# Patient Record
Sex: Male | Born: 1965 | Race: White | Hispanic: No | Marital: Married | Smoking: Never smoker
Health system: Southern US, Community
[De-identification: ages and names within clinical notes are randomized; demographics above are authoritative.]

## PROBLEM LIST (undated history)

## (undated) DIAGNOSIS — F411 Generalized anxiety disorder: Secondary | ICD-10-CM

## (undated) HISTORY — DX: Generalized anxiety disorder: F41.1

---

## 1998-11-03 ENCOUNTER — Emergency Department (HOSPITAL_COMMUNITY): Admission: EM | Admit: 1998-11-03 | Discharge: 1998-11-03 | Payer: Self-pay | Admitting: Emergency Medicine

## 1998-11-03 ENCOUNTER — Encounter: Payer: Self-pay | Admitting: Emergency Medicine

## 1998-11-06 ENCOUNTER — Emergency Department (HOSPITAL_COMMUNITY): Admission: EM | Admit: 1998-11-06 | Discharge: 1998-11-06 | Payer: Self-pay | Admitting: *Deleted

## 2004-06-30 ENCOUNTER — Emergency Department (HOSPITAL_COMMUNITY): Admission: AC | Admit: 2004-06-30 | Discharge: 2004-06-30 | Payer: Self-pay
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
## 2017-12-21 ENCOUNTER — Other Ambulatory Visit: Payer: Self-pay | Admitting: Family Medicine

## 2017-12-21 DIAGNOSIS — R4789 Other speech disturbances: Secondary | ICD-10-CM

## 2017-12-26 ENCOUNTER — Ambulatory Visit
Admission: RE | Admit: 2017-12-26 | Discharge: 2017-12-26 | Disposition: A | Discharge status: Home / Self Care | Payer: 59 | Source: Ambulatory Visit | Attending: Family Medicine | Admitting: Family Medicine

## 2017-12-26 DIAGNOSIS — R4789 Other speech disturbances: Secondary | ICD-10-CM

## 2018-02-22 ENCOUNTER — Encounter: Payer: Self-pay | Admitting: Neurology

## 2018-02-22 ENCOUNTER — Ambulatory Visit: Payer: 59 | Admitting: Neurology

## 2018-02-22 VITALS — BP 130/80 | HR 85 | Ht 70.0 in | Wt 188.5 lb

## 2018-02-22 DIAGNOSIS — R569 Unspecified convulsions: Secondary | ICD-10-CM | POA: Insufficient documentation

## 2018-02-22 MED ORDER — LAMOTRIGINE 100 MG PO TABS: 100.0000 mg | ORAL_TABLET | Freq: Two times a day (BID) | ORAL | 11 refills | Status: DC

## 2018-02-22 MED ORDER — LAMOTRIGINE 25 MG PO TABS: 25.0000 mg | ORAL_TABLET | Freq: Every day | ORAL | 0 refills | Status: DC

## 2018-02-22 NOTE — Progress Notes (Signed)
PATIENT: Corey Nelson DOB: 11-28-1965  Chief Complaint  Patient presents with  . Referral    Referral from  Dr. Andrey Campanile spells of speech loss room 4 wirh wife, pt goes to [place]      HISTORICAL  Corey Nelson is a 52 year old male, accompanied by his wife, seen in request by his primary care physician Dr. Andrey Campanile for evaluation of speech loss, initial evaluation was on February 22, 2018.  I have reviewed and summarized the referring note from the referring physician.  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, it tends to happen in his leisure time, but multiple episode a day, especially every morning when he brushes things, take a shower, he will work himself into a spell,  He was given Lexapro 10 mg daily, which has cut back the frequency of the spell, but each spell lasts longer, he was recently started on clonazepam 0.5 mg twice a day, which did not provide significant help, he also complains of excessive fatigue,     He denies history of seizure, recent laboratory evaluation from the [facility] was reported normal  I have personally reviewed MRI of the brain without contrast on December 26, 2017 that was normal  REVIEW OF SYSTEMS: Full 14 system review of systems performed and notable only for weight loss, fatigue, hearing loss, cough, confusion, allergy, anxiety, decreased energy All other review of systems were negative.  ALLERGIES: No Known Allergies  HOME MEDICATIONS: Current Outpatient Medications    Medication Sig Dispense Refill  . clonazePAM (KLONOPIN) 0.5 MG tablet Take one tablet twice a day for anxiety    . diphenhydrAMINE HCl (ALLERGY MED PO) Take by mouth.    Marland Kitchen ibuprofen (ADVIL,MOTRIN) 100 MG chewable tablet Chew by mouth every 8 (eight) hours as needed.     No current facility-administered medications for this visit.     PAST MEDICAL HISTORY: Past Medical History:  Diagnosis Date  . GAD (generalized anxiety disorder)     PAST SURGICAL HISTORY: History reviewed. No pertinent surgical history.  FAMILY HISTORY: Family History  Problem Relation Age of Onset  . Dementia Mother     SOCIAL HISTORY: Social History   Socioeconomic History  . Marital status: Married    Spouse name: Not on file  . Number of children: Not on file  . Years of education: Not on file  . Highest education level: Not on file  Occupational History  . Not on file  Social Needs  . Financial resource strain: Not on file  . Food insecurity:    Worry: Not on file    Inability: Not on file  . Transportation needs:    Medical: Not on file    Non-medical: Not on file  Tobacco Use  . Smoking status: Never Smoker  . Smokeless tobacco: Never Used  Substance and Sexual Activity  . Alcohol use: Not Currently  . Drug use: Not Currently  . Sexual activity: Not on file  Lifestyle  . Physical activity:    Days  per week: Not on file    Minutes per session: Not on file  . Stress: Not on file  Relationships  . Social connections:    Talks on phone: Not on file    Gets together: Not on file    Attends religious service: Not on file    Active member of club or organization: Not on file    Attends meetings of clubs or organizations: Not on file    Relationship status: Not on file  . Intimate partner violence:    Fear of current or ex partner: Not on file    Emotionally abused: Not on file    Physically abused: Not on file    Forced sexual activity: Not on file  Other Topics Concern  . Not on  file  Social History Narrative  . Not on file     PHYSICAL EXAM   Vitals:   02/22/18 0750  BP: 130/80  Pulse: 85  Weight: 188 lb 8 oz (85.5 kg)  Height: 5\' 10"  (1.778 m)    Not recorded      Body mass index is 27.05 kg/m.  PHYSICAL EXAMNIATION:  Gen: NAD, conversant, well nourised, obese, well groomed                     Cardiovascular: Regular rate rhythm, no peripheral edema, warm, nontender. Eyes: Conjunctivae clear without exudates or hemorrhage Neck: Supple, no carotid bruits. Pulmonary: Clear to auscultation bilaterally   NEUROLOGICAL EXAM:  MENTAL STATUS: Speech:    Speech is normal; fluent and spontaneous with normal comprehension.  Cognition:     Orientation to time, place and person     Normal recent and remote memory     Normal Attention span and concentration     Normal Language, naming, repeating,spontaneous speech     Fund of knowledge   CRANIAL NERVES: CN II: Visual fields are full to confrontation. Fundoscopic exam is normal with sharp discs and no vascular changes. Pupils are round equal and briskly reactive to light. CN III, IV, VI: extraocular movement are normal. No ptosis. CN V: Facial sensation is intact to pinprick in all 3 divisions bilaterally. Corneal responses are intact.  CN VII: Face is symmetric with normal eye closure and smile. CN VIII: Hearing is normal to rubbing fingers CN IX, X: Palate elevates symmetrically. Phonation is normal. CN XI: Head turning and shoulder shrug are intact CN XII: Tongue is midline with normal movements and no atrophy.  MOTOR: There is no pronator drift of out-stretched arms. Muscle bulk and tone are normal. Muscle strength is normal.  REFLEXES: Reflexes are 2+ and symmetric at the biceps, triceps, knees, and ankles. Plantar responses are flexor.  SENSORY: Intact to light touch, pinprick, positional sensation and vibratory sensation are intact in fingers and toes.  COORDINATION: Rapid alternating  movements and fine finger movements are intact. There is no dysmetria on finger-to-nose and heel-knee-shin.    GAIT/STANCE: Posture is normal. Gait is steady with normal steps, base, arm swing, and turning. Heel and toe walking are normal. Tandem gait is normal.  Romberg is absent.   DIAGNOSTIC DATA (LABS, IMAGING, TESTING) - I reviewed patient records, labs, notes, testing and imaging myself where available.   ASSESSMENT AND PLAN  Corey Nelson is a 52 y.o. male   Recurrent spells of sudden onset language difficulty  Possibility including partial seizure, versus anxiety related disorder  MRI of the brain was normal  EEG  Empirically treat him with  lamotrigine titrating to 100 mg twice a day  Keep clonazepam 0.5 mg twice a day he is [facility]  Document all events, return to clinic in 3 months   Levert FeinsteinYijun Ashni Lonzo, M.D. Ph.D.  [facility] [address], [address] [place], [place] [address] Ph: [phone] Fax: [phone]  CC:  Barbie BannerWilson, Fred H, MD

## 2018-03-01 ENCOUNTER — Other Ambulatory Visit: Payer: 59

## 2018-03-02 ENCOUNTER — Ambulatory Visit: Payer: 59 | Admitting: Neurology

## 2018-03-02 ENCOUNTER — Telehealth: Payer: Self-pay | Admitting: *Deleted

## 2018-03-02 DIAGNOSIS — R569 Unspecified convulsions: Secondary | ICD-10-CM

## 2018-03-02 NOTE — Telephone Encounter (Signed)
Patient is here for an EEG and is wondering if he could speak to someone about the medication that was prescribed.  States he is doing great with little side effect but is suppose to increase the dose and wonders if he should?  Please advise.

## 2018-03-02 NOTE — Telephone Encounter (Signed)
Dr. Terrace Arabia placed patient on a titrating dose of Lamictal 25mg .  She had planned for him to work his way up to 100mg , BID.  However, the patient confused the instructions and started taking 100mg , BID right away.  He started this dose on 02/22/18.  He only experienced mild dizziness that did not last more than a day.  Since this time, he has been doing well.  In fact, he said he is extremely pleased with the medication.  No episodes and feeling much better.  He will continue with his current Lamictal dose of 100mg , BID.  Dr. Terrace Arabia is aware of this event.

## 2018-03-04 NOTE — Procedures (Signed)
   HISTORY: 52 years old male, presented with recurrent episode of sudden word finding difficulties.  TECHNIQUE:  16 channel EEG was performed based on standard 10-16 international system. One channel was dedicated to EKG, which has demonstrates normal sinus rhythm of beats per minutes.  Upon awakening, the posterior background activity was well-developed, with diffuse small amplitude beta range activity reactive to eye opening and closure.  There was no evidence of epileptiform discharge.  Photic stimulation was performed, which induced a symmetric photic driving.  Hyperventilation was performed, there was no abnormality elicit.  No sleep was achieved.  CONCLUSION: This is a  normal awake EEG.  There is no electrodiagnostic evidence of epileptiform discharge.  The increased beta range activity is usually related to benzodiazepine use.  Levert Feinstein, M.D. Ph.D.  [facility] [address] [place], [place] [address] Phone: [phone] Fax:      [phone]

## 2018-05-24 ENCOUNTER — Encounter: Payer: Self-pay | Admitting: Neurology

## 2018-05-24 ENCOUNTER — Ambulatory Visit: Payer: 59 | Admitting: Neurology

## 2018-05-24 VITALS — BP 149/86 | HR 98 | Ht 70.0 in | Wt 190.0 lb

## 2018-05-24 DIAGNOSIS — F809 Developmental disorder of speech and language, unspecified: Secondary | ICD-10-CM | POA: Diagnosis not present

## 2018-05-24 MED ORDER — LAMOTRIGINE 100 MG PO TABS: 100.0000 mg | ORAL_TABLET | Freq: Two times a day (BID) | ORAL | 4 refills | Status: DC

## 2018-05-24 NOTE — Progress Notes (Signed)
PATIENT: Corey Nelson DOB: 07/13/1965  Chief Complaint  Patient presents with  . Seizure-like activity    He is here with his wife, [name].  He would like to review his brain MRI and EEG results. He is doing well on Lamictal 100mg  BID.  No further seizure-like activity since starting the medication.     HISTORICAL  Corey Nelson is a 52 year old male, accompanied by his wife, seen in request by his primary care physician Dr. Andrey CampanileWilson for evaluation of speech loss, initial evaluation was on February 22, 2018.  I have reviewed and summarized the referring note from the referring physician.  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, it tends to happen in his leisure time, but multiple episode a day, especially every morning when he brushes his teeth, take a shower, he will work himself into a spell,  He was given Lexapro 10 mg daily, which has cut back the frequency of the spell, but each spell lasts longer, he was recently started on clonazepam 0.5 mg twice a day, which did not provide significant help, he also complains of excessive fatigue,     He denies history of seizure, [facility]  I have personally reviewed MRI of the brain without contrast on December 26, 2017 that was normal  UPDATE May 24 2018: He started lamotrigine 100 mg, after 1 tablet, his daily spells of recurrent language difficulty spells has disappeared, he is not taking lamotrigine 100 mg twice a day, tolerating  well,  We personally reviewed MRI of the brain in July 2019 that was normal,  EEG was normal  REVIEW OF SYSTEMS: Full 14 system review of systems performed and notable only for fatigue, excessive sweating, hearing loss, ringing in ears, runny nose, constipation, joint pain, headaches, All other review of systems were negative.  ALLERGIES: No Known Allergies  HOME MEDICATIONS: Current Outpatient Medications  Medication Sig Dispense Refill  . diphenhydrAMINE HCl (ALLERGY MED PO) Take by mouth.    Marland Kitchen. ibuprofen (ADVIL,MOTRIN) 100 MG chewable tablet Chew by mouth every 8 (eight) hours as needed.    . lamoTRIgine (LAMICTAL) 100 MG tablet Take 1 tablet (100 mg total) by mouth 2 (two) times daily. 60 tablet 11   No current facility-administered medications for this visit.     PAST MEDICAL HISTORY: Past Medical History:  Diagnosis Date  . GAD (generalized anxiety disorder)     PAST SURGICAL HISTORY: History reviewed. No pertinent surgical history.  FAMILY HISTORY: Family History  Problem Relation Age of Onset  . Dementia Mother     SOCIAL HISTORY: Social History   Socioeconomic History  . Marital status: Married    Spouse name: Not on file  . Number of children: Not on file  . Years of education: Not on file  . Highest education level: Not on file  Occupational History  . Not on file  Social Needs  . Financial resource strain: Not on file  . Food insecurity:    Worry: Not on  file    Inability: Not on file  . Transportation needs:    Medical: Not on file    Non-medical: Not on file  Tobacco Use  . Smoking status: Never Smoker  . Smokeless tobacco: Never Used  Substance and Sexual Activity  . Alcohol use: Not Currently  . Drug use: Not Currently  . Sexual activity: Not on file  Lifestyle  . Physical activity:    Days per week: Not on file    Minutes per session: Not on file  . Stress: Not on file  Relationships  . Social connections:    Talks on phone: Not on  file    Gets together: Not on file    Attends religious service: Not on file    Active member of club or organization: Not on file    Attends meetings of clubs or organizations: Not on file    Relationship status: Not on file  . Intimate partner violence:    Fear of current or ex partner: Not on file    Emotionally abused: Not on file    Physically abused: Not on file    Forced sexual activity: Not on file  Other Topics Concern  . Not on file  Social History Narrative  . Not on file     PHYSICAL EXAM   Vitals:   05/24/18 0711  BP: (!) 149/86  Pulse: 98  Weight: 190 lb (86.2 kg)  Height: 5\' 10"  (1.778 m)    Not recorded      Body mass index is 27.26 kg/m.  PHYSICAL EXAMNIATION:  Gen: NAD, conversant, well nourised, obese, well groomed                     Cardiovascular: Regular rate rhythm, no peripheral edema, warm, nontender. Eyes: Conjunctivae clear without exudates or hemorrhage Neck: Supple, no carotid bruits. Pulmonary: Clear to auscultation bilaterally   NEUROLOGICAL EXAM:  MENTAL STATUS: Speech:    Speech is normal; fluent and spontaneous with normal comprehension.  Cognition:     Orientation to time, place and person     Normal recent and remote memory     Normal Attention span and concentration     Normal Language, naming, repeating,spontaneous speech     Fund of knowledge   CRANIAL NERVES: CN II: Visual fields are full to confrontation.  Pupils are round equal and briskly reactive to light. CN III, IV, VI: extraocular movement are normal. No ptosis. CN V: Facial sensation is intact to pinprick in all 3 divisions bilaterally. Corneal responses are intact.  CN VII: Face is symmetric with normal eye closure and smile. CN VIII: Hearing is normal to rubbing fingers CN IX, X: Palate elevates symmetrically. Phonation is normal. CN XI: Head turning and shoulder shrug are intact CN XII: Tongue is midline with normal movements and no  atrophy.  MOTOR: There is no pronator drift of out-stretched arms. Muscle bulk and tone are normal. Muscle strength is normal.  REFLEXES: Reflexes are 2+ and symmetric at the biceps, triceps, knees, and ankles. Plantar responses are flexor.  SENSORY: Intact to light touch, pinprick, positional sensation and vibratory sensation are intact in fingers and toes.  COORDINATION: Rapid alternating movements and fine finger movements are intact. There is no dysmetria on finger-to-nose and heel-knee-shin.    GAIT/STANCE: Posture is normal. Gait is steady with normal steps, base, arm swing, and turning. Heel and toe walking are normal. Tandem gait is normal.  Romberg is absent.  DIAGNOSTIC DATA (LABS, IMAGING, TESTING) - I reviewed patient records, labs, notes, testing and imaging myself where available.   ASSESSMENT AND PLAN  FERGUS THRONE is a 52 y.o. male   Recurrent spells of sudden onset language difficulty  Possibility including partial seizure, versus anxiety related disorder  MRI of the brain was normal  EEG was normal  Responded very well to lamotrigine 100 mg twice a day  Refill his lamotrigine, only return to clinic for new issues   Levert Feinstein, M.D. Ph.D.  [facility] [address] [place], [place] [address] Ph: [phone] Fax: [phone]  CC:  Barbie Banner, MD

## 2018-06-20 ENCOUNTER — Telehealth: Payer: Self-pay | Admitting: Neurology

## 2018-06-20 NOTE — Telephone Encounter (Signed)
Pt states he was supposed to receive a 3 month supply for his lamoTRIgine (LAMICTAL) 100 MG tablet when he picked it up at the pharmacy it was only a 1 month supply. Please advise.

## 2018-06-20 NOTE — Telephone Encounter (Signed)
A 90-day rx with 4 additional refills was sent by Dr. Terrace Arabia on 05/24/18.  I returned the call to the patient to let him know.  He is going to check with the pharmacy.  I also notified him that he will need yearly follow up appointments for medication managment and he verbalized understanding.

## 2018-08-11 ENCOUNTER — Telehealth: Payer: Self-pay | Admitting: Neurology

## 2018-08-11 NOTE — Telephone Encounter (Signed)
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 Would it be beneficial for provider to review the report as it might relate to his case.

## 2018-08-11 NOTE — Telephone Encounter (Signed)
Per vo by Dr. Terrace Arabia, she feels it would be better for a toxicologist to further review the type of mold he has been exposed to.  I returned the call to the patient and he was in agreement with this plan.

## 2018-09-08 ENCOUNTER — Ambulatory Visit (INDEPENDENT_AMBULATORY_CARE_PROVIDER_SITE_OTHER): Payer: Worker's Compensation | Admitting: Allergy & Immunology

## 2018-09-08 ENCOUNTER — Other Ambulatory Visit: Payer: Self-pay

## 2018-09-08 VITALS — BP 122/80 | HR 81 | Temp 97.6°F | Resp 18 | Ht 70.0 in | Wt 196.4 lb

## 2018-09-08 DIAGNOSIS — R5383 Other fatigue: Secondary | ICD-10-CM | POA: Diagnosis not present

## 2018-09-08 DIAGNOSIS — R51 Headache: Secondary | ICD-10-CM | POA: Diagnosis not present

## 2018-09-08 DIAGNOSIS — R519 Headache, unspecified: Secondary | ICD-10-CM

## 2018-09-08 NOTE — Progress Notes (Signed)
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 Corey Nelson presents for an evaluation of mold allergies.  He has a longstanding history of fatigue and headaches, which he feels might be related to mold exposure at his previous workplace.  Although he does have some rhinorrhea, it does not seem to be consistent with exposure to the workplace.  It should be noted that his symptoms have continued unabated despite being away from this mold for approximately 3, now going on 4, months.  Therefore, I do not think that this is related to his symptoms, as if it were allergies symptoms would have resolved once he was not being exposed.  There was a finding of elevated mold spores in the environment, but per the patient and case managers report there was no comparison to outside spore levels.  Therefore, these might not be relevant at all.  Report, we might be able to compare it to outside pollen levels on the day of the sampling, but again there is so much variability in allergen testing and environment that the actual outside mold exposure right outside the work site might not even be reflective of samples obtained at pollen monitoring stations around the city, depending on their proximity to his previous work site.   We are going to have him come in for skin testing once he has been off of his antihistamines for an appropriate time frame. We are going to get some labs as well, including an extend mold IgE panel and a specific IgE for Stachybotyrs chartarum. All of this together will at least prove mold sensitization or lack thereof. However, it should be noted that sensitization does not equal causation.    Plan/Recommendations:   1. Fatigue and headaches - We will get some lab work to look for  evidence of mold allergies. - We will also schedule you for skin testing (you will need to be off of your Benadryl for 3 days).  - Take one prednisone tablet twice daily while you are off of the Benadryl (this might help control your vertigo while you are off of the antihistamines). - Bring the mold testing report.   2. Return in about 1 week (around 09/15/2018) for SKIN TESTING.    Subjective:   Corey Nelson is a 53 y.o. male presenting today for evaluation of No chief complaint on file.   Corey Nelson has a history of the following: Patient Active Problem List   Diagnosis Date Noted   Language difficulty 05/24/2018   Seizure-like activity (HCC) 02/22/2018    History obtained from: chart review and patient and workers comp case Production designer, theatre/television/film Corey Nelson).  Corey Nelson was referred by Corey Banner, MD.     Corey Nelson is a 53 y.o. male presenting for an evaluation of possible mold allergy.  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  Patient reports that he has a longstanding history of chronic fatigue and headaches.  This started in 2015 or 2016, after having started working with the [employer] in [place].  He never really attributed this to his work environment and it should be noted that he retired and has not been in the building since June 07, 2018.  However, around 2 months ago there was mold discovered in  the office where he worked.  This was a smaller building that was separate from the main building and separated by a loading dock. Evidently, somewhat he still works there pulled some filing cabinets away from the wall and noticed a black film over the wall.  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 However it seems that there was not comparison between the outside air and the inside air. They do not have that report today, but he will bring in the paperwork when he comes in next time for the testing (he  took an antihistamines both on Monday and Tuesday, <48 hors from today's visit).  He does endorse some clear rhinorrhea with throat clearing, but it should be noted that this has continued despite being away from the work environment for more than three months. He does have some headaches, although again these have continued even in the absence of personal exposure to the moldy environment. He denies any eye issues at all. Thinking back to his symptoms, he denies that his symptoms got any better when he was away from the workplace. He might have felt better knowing that he was not having to go into work, but he did not have any changes regarding his fatigue and headaches. He had been away from work for a period of two weeks at a time, without improvement in his symptoms.   He was never treated with a nasal steroid for these headache. He was on antihistamines fairly regularly due to his long standing history of vertigo, dating back to the early 1990s. He was on meclizine for a period of time and then he changed to Benadryl alone which is working just as well at the meclizine did. He did seen ENT in the distant past but he has had no visits recently.   He does have a significant neurological history as well.  In 2017 he started having episodes of loss speech and comprehension.  He had a very large work-up including an MRI, EEG, and CT which were all normal per the patient.  Review of our system shows an MRI from July 2019 that showed no abnormality of the brain, but the sinuses showed a significant maxillary sinus opacity greater on the right, which was felt to be consistent with a retention cyst.  Sphenoid, ethmoid, and frontal sinuses are clear.  For these episodes, he was placed on some anxiety meds including Prozac which did not really help; this was changed to Lexapro at some point.  It was occurring up to 6 times a day but has now improved.  Working diagnosis is been generalized anxiety  disorder.  Otherwise, there is no history of other atopic diseases, including food allergies, drug allergies, stinging insect allergies, eczema, urticaria or contact dermatitis. There is no significant infectious history. Vaccinations are up to date.    Past Medical History: Patient Active Problem List   Diagnosis Date Noted   Language difficulty 05/24/2018   Seizure-like activity (HCC) 02/22/2018    Medication List:  Allergies as of 09/08/2018   No Known Allergies     Medication List       Accurate as of September 08, 2018 11:59 PM. Always use your most recent med list.        ALLERGY MED PO Take by mouth.   ibuprofen 100 MG chewable tablet Commonly known as:  ADVIL,MOTRIN Chew by mouth every 8 (eight) hours as needed.   lamoTRIgine 100 MG tablet Commonly known  as:  LaMICtal Take 1 tablet (100 mg total) by mouth 2 (two) times daily.       Birth History: non-contributory  Developmental History: non-contributory  Past Surgical History: No past surgical history on file.   Family History: Family History  Problem Relation Age of Onset   Dementia Mother      Social History: Aysen lives at home with his wife and three children. There is a 19yo working on a degree in nursing, 54yo working as a Nutritional therapist, and a 54yo working as a Radiation protection practitioner. They live in a house that is 54 years old. There is carpeting throughout the home. They have electric heating and central cooling. There are no animals inside or outside of the home. There are no dust mite coverings on the bedding. 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 He is not a smoker at all. He did serve in the armed forces during the [place] War.    Review of Systems  Constitutional: Positive for malaise/fatigue. Negative for chills, fever and weight loss.  HENT: Positive for tinnitus. Negative for congestion, ear discharge, ear pain, nosebleeds and sinus pain.        Positive for vertigo.  Eyes:  Negative for pain, discharge and redness.  Respiratory: Negative for cough, sputum production, shortness of breath and wheezing.   Cardiovascular: Negative.  Negative for chest pain and palpitations.  Gastrointestinal: Negative for abdominal pain, heartburn, nausea and vomiting.  Musculoskeletal: Negative for myalgias.  Skin: Negative.  Negative for itching and rash.  Neurological: Positive for headaches. Negative for dizziness.  Endo/Heme/Allergies: Negative for environmental allergies. Does not bruise/bleed easily.       Objective:   There were no vitals taken for this visit. There is no height or weight on file to calculate BMI.   Physical Exam:   Physical Exam  Constitutional: He appears well-developed. He is active.  HENT:  Head: Normocephalic and atraumatic.  Right Ear: Tympanic membrane, external ear and ear canal normal. No drainage, swelling or tenderness. Tympanic membrane is not injected, not scarred, not erythematous, not retracted and not bulging.  Left Ear: Tympanic membrane, external ear and ear canal normal. No drainage, swelling or tenderness. Tympanic membrane is not injected, not scarred, not erythematous, not retracted and not bulging.  Nose: No mucosal edema, rhinorrhea, nasal deformity or septal deviation. No epistaxis. Right sinus exhibits frontal sinus tenderness. Right sinus exhibits no maxillary sinus tenderness. Left sinus exhibits frontal sinus tenderness. Left sinus exhibits no maxillary sinus tenderness.  Mouth/Throat: Uvula is midline and oropharynx is clear and moist. Mucous membranes are not pale and not dry.  No rhinorrhea present. Septum midline. Turbinates are normal sized.   Eyes: Pupils are equal, round, and reactive to light. Conjunctivae and EOM are normal. Right eye exhibits no chemosis and no discharge. Left eye exhibits no chemosis and no discharge. Right conjunctiva is not injected. Left conjunctiva is not injected.  Cardiovascular: Normal  rate, regular rhythm, S1 normal, S2 normal and normal heart sounds.  Respiratory: Effort normal and breath sounds normal. No accessory muscle usage. No tachypnea. No respiratory distress. He has no wheezes. He has no rhonchi. He has no rales. He exhibits no tenderness.  Moving air well in all lung fields. No crackles or wheezes noted.   GI: There is no abdominal tenderness. There is no rebound and no guarding.  Lymphadenopathy:       Head (right side): No submandibular, no tonsillar and no occipital  adenopathy present.       Head (left side): No submandibular, no tonsillar and no occipital adenopathy present.    He has no cervical adenopathy.  Neurological: He is alert.  Skin: No abrasion, no petechiae and no rash noted. Rash is not macular, not papular, not pustular, not vesicular and not urticarial. No erythema. No pallor.  Psychiatric: He has a normal mood and affect.     Diagnostic studies: deferred due to recent antihistamine use     Malachi Bonds, MD [facility]

## 2018-09-08 NOTE — Patient Instructions (Addendum)
1. Fatigue and headaches - We will get some lab work to look for evidence of mold allergies. - We will also schedule you for skin testing (you will need to be off of your Benadryl for 3 days).  - Take one prednisone tablet twice daily while you are off of the Benadryl (this might help control your vertigo while you are off of the antihistamines). - Bring the mold testing report.   2. Return in about 1 week (around 09/15/2018) for SKIN TESTING.    Please inform us of any Emergency Department visits, hospitalizations, or changes in symptoms. Call us before going to the ED for breathing or allergy symptoms since we might be able to fit you in for a sick visit. Feel free to contact us anytime with any questions, problems, or concerns.  It was a pleasure to meet you today!  Websites that have reliable patient information: 1. American Academy of Asthma, Allergy, and Immunology: www.aaaai.org 2. Food Allergy Research and Education (FARE): foodallergy.org 3. Mothers of Asthmatics: http://www.asthmacommunitynetwork.org 4. American College of Allergy, Asthma, and Immunology: www.acaai.org  "Like" [place] on Facebook and Instagram for our latest updates!      Make sure you are registered to vote! If you have moved or changed any of your contact information, you will need to get this updated before voting!    Voter ID laws are NOT going into effect for the General Election in November 2020! DO NOT let this stop you from exercising your right to vote!

## 2018-09-09 ENCOUNTER — Encounter: Payer: Self-pay | Admitting: Allergy & Immunology

## 2018-09-10 LAB — IGE+ALLERGENS ZONE 2(30)

## 2018-09-10 LAB — ALLERGEN PROFILE, MOLD
Aureobasidi Pullulans IgE: <0.1 kU/L
Candida Albicans IgE: <0.1 kU/L
M009-IgE Fusarium proliferatum: <0.1 kU/L
M014-IgE Epicoccum purpur: <0.1 kU/L
Phoma Betae IgE: <0.1 kU/L
Setomelanomma Rostrat: <0.1 kU/L

## 2018-09-10 LAB — M024-IGE STACHYBOTRYS ATRA: M024-IgE Stachybotrys atra: <0.1 kU/L

## 2018-09-13 ENCOUNTER — Ambulatory Visit: Payer: 59 | Admitting: Allergy and Immunology

## 2018-09-14 ENCOUNTER — Other Ambulatory Visit: Payer: Self-pay

## 2018-09-14 ENCOUNTER — Encounter: Payer: Self-pay | Admitting: Allergy

## 2018-09-14 ENCOUNTER — Ambulatory Visit (INDEPENDENT_AMBULATORY_CARE_PROVIDER_SITE_OTHER): Payer: Worker's Compensation | Admitting: Allergy

## 2018-09-14 ENCOUNTER — Ambulatory Visit: Payer: Self-pay | Admitting: Allergy

## 2018-09-14 VITALS — BP 102/72 | HR 85 | Temp 97.4°F | Resp 16 | Ht 70.0 in

## 2018-09-14 DIAGNOSIS — T732XXD Exhaustion due to exposure, subsequent encounter: Secondary | ICD-10-CM | POA: Diagnosis not present

## 2018-09-14 DIAGNOSIS — G44229 Chronic tension-type headache, not intractable: Secondary | ICD-10-CM | POA: Diagnosis not present

## 2018-09-14 NOTE — Progress Notes (Signed)
Follow-up Note  RE: Corey Nelson MRN: [id number] DOB: Feb 13, 1966 Date of Office Visit: 09/14/2018   History of present illness: Corey Nelson is a 53 y.o. male presenting today for skin testing visit.  He was seen for initial visit on 09/08/18 by Dr. Dellis Anes.  He was unable to perform skin testing at this visit due to antihistamine use.  He has held his benadryl for the past 5 days.   He denies any major health changes or new symptoms since this last visit.    Review of systems: Review of Systems  Constitutional: Positive for malaise/fatigue. Negative for chills and fever.  HENT: Positive for tinnitus. Negative for congestion, ear discharge, ear pain, nosebleeds and sinus pain.   Eyes: Negative for pain, discharge and redness.  Respiratory: Negative for cough, shortness of breath and wheezing.   Cardiovascular: Negative for chest pain.  Gastrointestinal: Negative for abdominal pain, heartburn, nausea and vomiting.  Musculoskeletal: Negative for joint pain.  Skin: Negative for itching and rash.  Neurological: Positive for headaches.    All other systems negative unless noted above in HPI  Past medical/social/surgical/family history have been reviewed and are unchanged unless specifically indicated below.  No changes  Medication List: Allergies as of 09/14/2018   No Known Allergies     Medication List       Accurate as of September 14, 2018 10:53 AM. Always use your most recent med list.        ALLERGY MED PO Take by mouth.   ibuprofen 100 MG chewable tablet Commonly known as:  ADVIL,MOTRIN Chew by mouth every 8 (eight) hours as needed.   lamoTRIgine 100 MG tablet Commonly known as:  LaMICtal Take 1 tablet (100 mg total) by mouth 2 (two) times daily.       Known medication allergies: No Known Allergies   Physical examination: Blood pressure 102/72, pulse 85, temperature (!) 97.4 F (36.3 C), temperature source Oral, resp. rate 16, height 5\' 10"  (1.778 m),  SpO2 94 %.  General: Alert, interactive, in no acute distress. HEENT: PERRLA, TMs pearly gray, turbinates non-edematous without discharge, post-pharynx non erythematous. Neck: Supple without lymphadenopathy. Lungs: Clear to auscultation without wheezing, rhonchi or rales. {no increased work of breathing. CV: Normal S1, S2 without murmurs. Abdomen: Nondistended, nontender. Skin: Warm and dry, without lesions or rashes. Extremities:  No clubbing, cyanosis or edema. Neuro:   Grossly intact.  Diagnositics/Labs: Labs:  Component     Latest Ref Rng & Units 09/08/2018  Class Description      Comment  IgE (Immunoglobulin E), Serum     6 - 495 IU/mL 14  D Pteronyssinus IgE     Class 0 kU/L <0.10  D Farinae IgE     Class 0 kU/L <0.10  Cat Dander IgE     Class 0 kU/L <0.10  Dog Dander IgE     Class 0 kU/L <0.10  [place] Grass IgE     Class 0 kU/L <0.10  Timothy Grass IgE     Class 0 kU/L <0.10  Johnson Grass IgE     Class 0 kU/L <0.10  Bahia Grass IgE     Class 0 kU/L <0.10  Cockroach, American IgE     Class 0 kU/L <0.10  Penicillium Chrysogen IgE     Class 0 kU/L <0.10  Cladosporium Herbarum IgE     Class 0 kU/L <0.10  Aspergillus Fumigatus IgE     Class 0 kU/L <0.10  Mucor Racemosus IgE  Class 0 kU/L <0.10  Alternaria Alternata IgE     Class 0 kU/L <0.10  Stemphylium Herbarum IgE     Class 0 kU/L <0.10  Common Silver Charletta CousinBirch IgE     Class 0 kU/L <0.10  Oak, White IgE     Class 0 kU/L <0.10  Elm, American IgE     Class 0 kU/L <0.10  Maple/Box Elder IgE     Class 0 kU/L <0.10  Hickory, White IgE     Class 0 kU/L <0.10  Amer Sycamore IgE Qn     Class 0 kU/L <0.10  White Mulberry IgE     Class 0 kU/L <0.10  Sweet gum IgE RAST Ql     Class 0 kU/L <0.10  Cedar, [place] IgE     Class 0 kU/L <0.10  Ragweed, Short IgE     Class 0 kU/L <0.10  Mugwort IgE Qn     Class 0 kU/L <0.10  Plantain, English IgE     Class 0 kU/L <0.10  Pigweed, Rough IgE     Class 0 kU/L  <0.10  Sheep Sorrel IgE Qn     Class 0 kU/L <0.10  Nettle IgE     Class 0 kU/L <0.10  Candida Albicans IgE     Class 0 kU/L <0.10  Setomelanomma Rostrat     Class 0 kU/L <0.10  M009-IgE Fusarium proliferatum     Class 0 kU/L <0.10  Aureobasidi Pullulans IgE     Class 0 kU/L <0.10  Phoma Betae IgE     Class 0 kU/L <0.10  M014-IgE Epicoccum purpur     Class 0 kU/L <0.10  M024-IgE Stachybotrys atra     Class 0 kU/L <0.10    Allergy testing: 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 Intradermal skin testing is negative.   Allergy testing results were read and interpreted by provider, documented by clinical staff.   Assessment and plan:   1. 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 Dellis AnesGallagher to review  2. Return in about 4-6 months for follow-up with Dr. Dellis AnesGallagher   I appreciate the opportunity to take part in Corey Nelson's care. Please do not hesitate to contact me with questions.  Sincerely,   Margo AyeShaylar Sakiyah Shur, MD Allergy/Immunology [facility]

## 2018-09-14 NOTE — Patient Instructions (Addendum)
1. 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 Dellis Anes to review  2. Return in about 4-6 months for follow-up with Dr. Dellis Anes

## 2018-10-18 ENCOUNTER — Other Ambulatory Visit: Payer: Self-pay

## 2018-10-18 ENCOUNTER — Ambulatory Visit (INDEPENDENT_AMBULATORY_CARE_PROVIDER_SITE_OTHER): Payer: Worker's Compensation | Admitting: Allergy & Immunology

## 2018-10-18 ENCOUNTER — Encounter: Payer: Self-pay | Admitting: Allergy & Immunology

## 2018-10-18 VITALS — BP 124/82 | HR 86 | Temp 97.3°F | Resp 16 | Ht 68.5 in | Wt 196.2 lb

## 2018-10-18 DIAGNOSIS — R51 Headache: Secondary | ICD-10-CM | POA: Diagnosis not present

## 2018-10-18 DIAGNOSIS — T732XXD Exhaustion due to exposure, subsequent encounter: Secondary | ICD-10-CM | POA: Diagnosis not present

## 2018-10-18 DIAGNOSIS — R519 Headache, unspecified: Secondary | ICD-10-CM

## 2018-10-18 NOTE — Progress Notes (Signed)
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 His fatigue has actually improved since the last visit. He does have the avoidance measures from the skin testing visit. We discussed avoidance measures briefly today. I also emphasized that the mold that he tested positive to on skin testing was not the same mold that was discovered in his workplace. We actually sent an IgE level to the mold isolated in his workplace and this was negative.  This does have excellent negative predictive value, so I would conjecture to say that this is not related to his fatigue and headaches.  He can follow-up with us on an as-needed basis.  Plan/Recommendations:   1. 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 - Testing was negative to many other molds, including Stachybotrys atra. - Avoidance measures provided.  2. Return if symptoms worsen or fail to improve.   Subjective:   Corey Nelson is a 53 y.o. male presenting today for follow up of  Chief Complaint  Patient presents with  . Follow up    Corey BertholdLarry D Nelson has a history of the following: Patient Active Problem List   Diagnosis Date Noted  . Language difficulty 05/24/2018  . Seizure-like activity (HCC) 02/22/2018    History obtained from: chart review and patient and case worker.  Corey Nelson is a 53 y.o. male presenting for a follow up visit.  Corey Nelson is a workers comp case.  I first evaluated him on April 2.  He presented with chronic headaches and fatigue which he felt was secondary to a previous workplace which was found to be contaminated with mold after he had retired.  At his first visit, I obtained an environmental allergy panel as well as an extended mold panel which was completely  negative.  He then followed up for allergy testing April 8 with Dr. Delorse LekPadgett.  He was positive to only 1 mold - Botrytis cinera - but was negative to the remaining molds.   Since the last visit, he has done well. He does report that his fatigue has improved somewhat since the last visit. He did get avoidance measures for mold after the last visit. He is unsure where his case goes at this point.   Otherwise, there have been no changes to his past medical history, surgical history, family history, or social history. His case work accompanies him today and does not seem to have many questions today. She does have all of the medical records. 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 Apparently he works for the [employer] rather than the [employer].    Review of Systems  Constitutional: Positive for malaise/fatigue. Negative for fever and weight loss.  HENT: Negative.  Negative for congestion, ear discharge and ear pain.   Eyes: Negative for pain, discharge and redness.  Respiratory: Negative for cough, sputum production, shortness of breath and wheezing.   Cardiovascular: Negative.  Negative for chest pain and palpitations.  Gastrointestinal: Negative for abdominal pain and heartburn.  Skin: Negative.  Negative for itching and rash.  Neurological: Negative for dizziness and headaches.  Endo/Heme/Allergies: Negative for environmental allergies. Does not bruise/bleed easily.       Objective:   Blood pressure 124/82, pulse  86, temperature (!) 97.3 F (36.3 C), temperature source Tympanic, resp. rate 16, height 5' 8.5" (1.74 m), weight 196 lb 3.2 oz (89 kg), SpO2 96 %. Body mass index is 29.39 kg/m.   Physical Exam:  Physical Exam  Constitutional: He appears well-developed.  HENT:  Head: Normocephalic and atraumatic.  Right Ear: Tympanic membrane, external ear and ear canal normal.  Left Ear: Tympanic membrane and ear canal  normal.  Nose: No mucosal edema, rhinorrhea, nasal deformity or septal deviation. No epistaxis. Right sinus exhibits no maxillary sinus tenderness and no frontal sinus tenderness. Left sinus exhibits no maxillary sinus tenderness and no frontal sinus tenderness.  Mouth/Throat: Uvula is midline and oropharynx is clear and moist. Mucous membranes are not pale and not dry.  Eyes: Pupils are equal, round, and reactive to light. Conjunctivae and EOM are normal. Right eye exhibits no chemosis and no discharge. Left eye exhibits no chemosis and no discharge. Right conjunctiva is not injected. Left conjunctiva is not injected.  Cardiovascular: Normal rate, regular rhythm and normal heart sounds.  Respiratory: Effort normal and breath sounds normal. No accessory muscle usage. No tachypnea. No respiratory distress. He has no wheezes. He has no rhonchi. He has no rales. He exhibits no tenderness.  Lymphadenopathy:    He has no cervical adenopathy.  Neurological: He is alert.  Skin: No abrasion, no petechiae and no rash noted. Rash is not papular, not vesicular and not urticarial. No erythema. No pallor.  Psychiatric: He has a normal mood and affect.     Diagnostic studies: none      Malachi Bonds, MD  [facility]

## 2018-10-18 NOTE — Patient Instructions (Addendum)
1. 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 - Testing was negative to many other molds, including Stachybotrys atra. - Avoidance measures provided.  2. Return if symptoms worsen or fail to improve.    Please inform us of any Emergency Department visits, hospitalizations, or changes in symptoms. Call us before going to the ED for breathing or allergy symptoms since we might be able to fit you in for a sick visit. Feel free to contact us anytime with any questions, problems, or concerns.  It was a pleasure to see you again today!  Websites that have reliable patient information: 1. American Academy of Asthma, Allergy, and Immunology: www.aaaai.org 2. Food Allergy Research and Education (FARE): foodallergy.org 3. Mothers of Asthmatics: http://www.asthmacommunitynetwork.org 4. American College of Allergy, Asthma, and Immunology: www.acaai.org  "Like" [place] on Facebook and Instagram for our latest updates!      Make sure you are registered to vote! If you have moved or changed any of your contact information, you will need to get this updated before voting!    Voter ID laws are NOT going into effect for the General Election in November 2020! DO NOT let this stop you from exercising your right to vote!

## 2019-01-25 ENCOUNTER — Telehealth: Payer: Self-pay | Admitting: Neurology

## 2019-01-25 ENCOUNTER — Telehealth: Payer: Self-pay | Admitting: *Deleted

## 2019-01-25 NOTE — Telephone Encounter (Signed)
Letter was generated

## 2019-01-25 NOTE — Telephone Encounter (Signed)
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  The PA completing the examination needs to know why the patient has been placed on lamotrigine and if you feel he is safe to drive.  The patient states his last episode occurred in September 2019.  He needs to know if he was actually diagnosed with seizures.  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

## 2019-01-25 NOTE — Telephone Encounter (Signed)
Pt call need letter stating why he is taking lamotrigine,and is it safe for him to drive while taking this medication.

## 2019-01-25 NOTE — Telephone Encounter (Signed)
Returned call to the patient and he would like the letter mailed to his home address (verified in chart).

## 2019-03-14 ENCOUNTER — Ambulatory Visit: Payer: 59 | Admitting: Allergy & Immunology

## 2019-06-16 ENCOUNTER — Other Ambulatory Visit: Payer: Self-pay | Admitting: Neurology

## 2019-06-18 IMAGING — MR MR HEAD W/O CM
11 series · 48 of 48 positions shown · non-contrast
Comparison: None.

CLINICAL DATA: Seizure-like episodes. Confusion, difficulty
talking, and general weakness.

EXAM:
MRI HEAD WITHOUT CONTRAST
TECHNIQUE: Multiplanar, multiecho pulse sequences of the brain and surrounding
structures were obtained without intravenous contrast.

[Series 5: T1 · sagittal · 4.0mm · 0.75mm/px · 3 of 31 slices shown (1 of 2)]
[im 1/31]
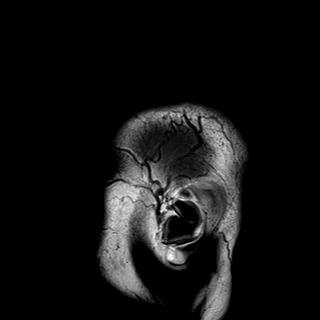
[im 16/31]
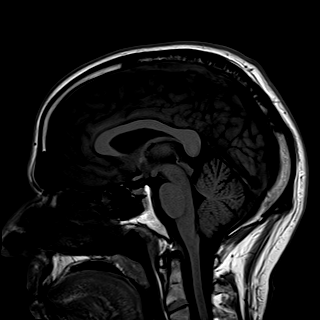
[im 31/31]
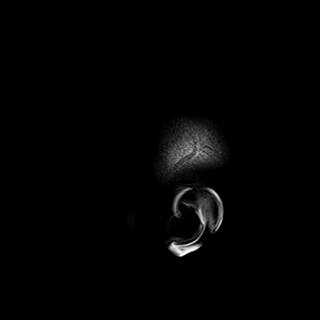

[Series 6: DWI · axial · 3.0mm · 1.44mm/px · z∈[-79,+80]mm · 7 of 100 slices shown (1 of 4)]
[im 1/100]
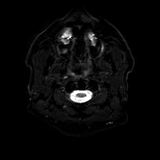
[im 17/100]
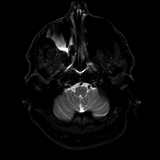
[im 34/100]
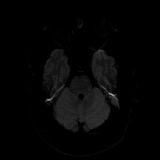
[im 50/100]
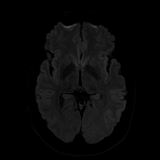
[im 67/100]
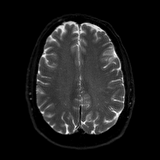
[im 83/100]
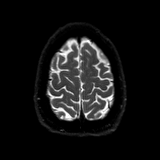
[im 100/100]
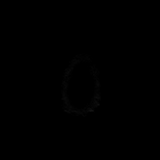

[Series 7: DWI · axial · 3.0mm · 1.44mm/px · z∈[-79,+80]mm · 3 of 50 slices shown (2 of 4)]
[im 1/50]
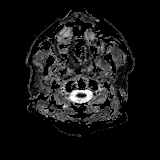
[im 25/50]
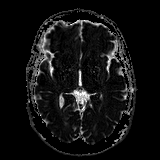
[im 50/50]
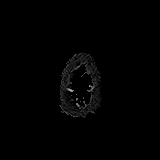

[Series 8: DWI · coronal · 5.0mm · 1.44mm/px · 5 of 68 slices shown (3 of 4)]
[im 1/68]
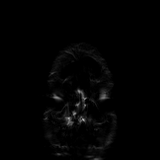
[im 17/68]
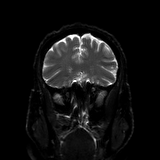
[im 34/68]
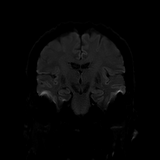
[im 51/68]
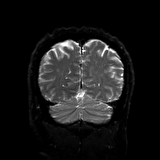
[im 68/68]
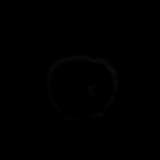

[Series 9: DWI · coronal · 5.0mm · 1.44mm/px · 2 of 34 slices shown (4 of 4)]
[im 1/34]
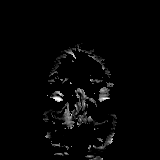
[im 34/34]
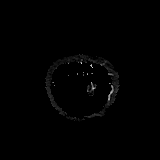

[Series 10: T2 · axial · 4.0mm · 0.36mm/px · z∈[-81,+88]mm · 2 of 34 slices shown (1 of 3)]
[im 1/34]
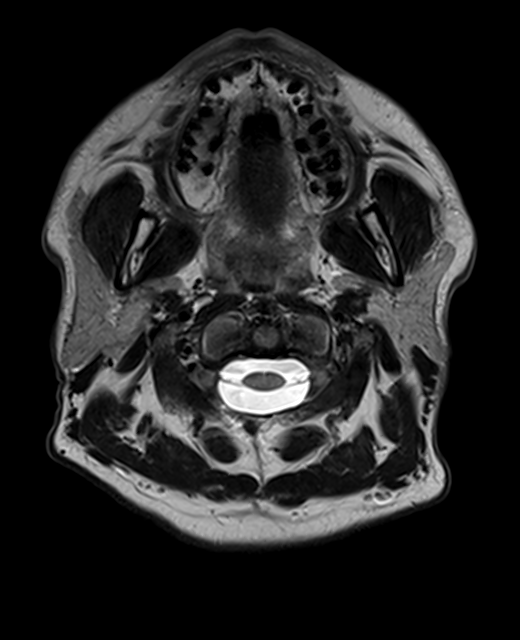
[im 34/34]
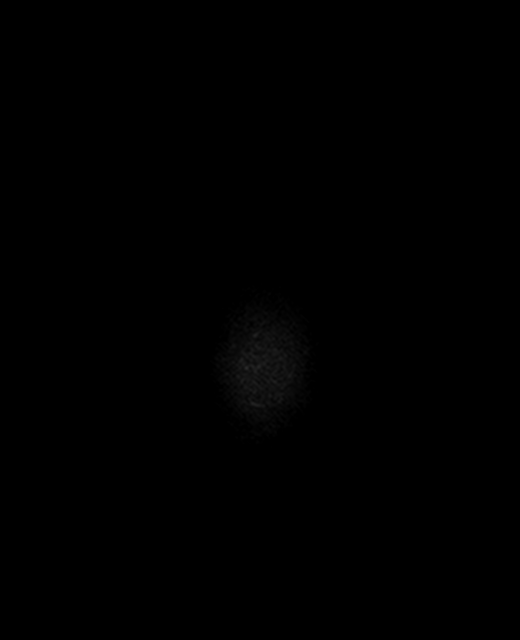

[Series 11: FLAIR · axial · 3.0mm · 0.72mm/px · z∈[-85,+86]mm · 2 of 30 slices shown]
[im 1/30]
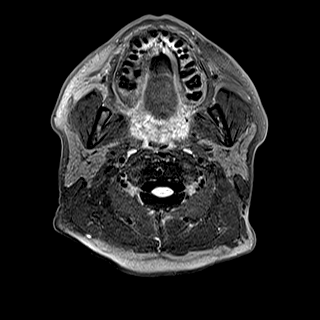
[im 30/30]
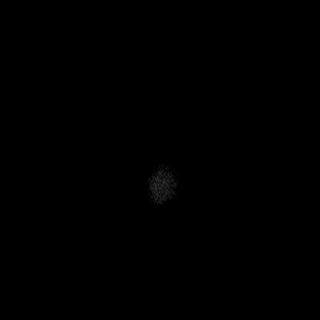

[Series 13: swi_images · axial · 1.5mm · 0.90mm/px · z∈[-67,+74]mm · 7 of 96 slices shown]
[im 1/96]
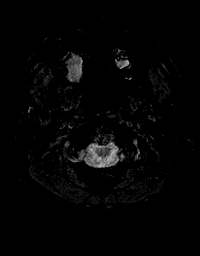
[im 16/96]
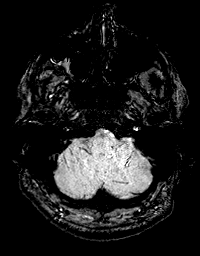
[im 32/96]
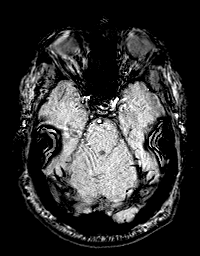
[im 48/96]
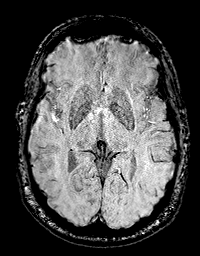
[im 64/96]
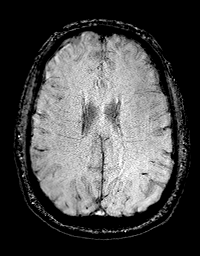
[im 80/96]
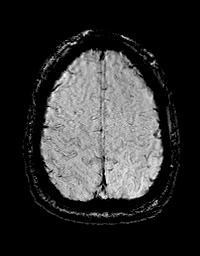
[im 96/96]
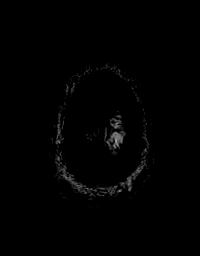

[Series 14: T1 · axial · 1.0mm · 0.90mm/px · z∈[-82,+90]mm · 12 of 176 slices shown (2 of 2)]
[im 1/176]
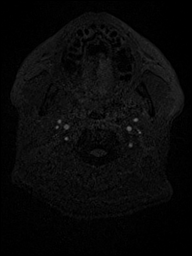
[im 16/176]
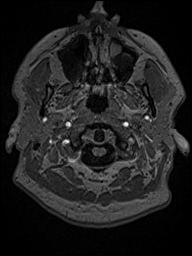
[im 32/176]
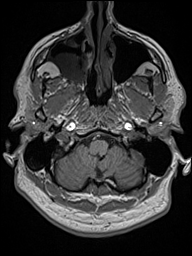
[im 48/176]
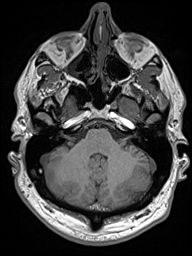
[im 64/176]
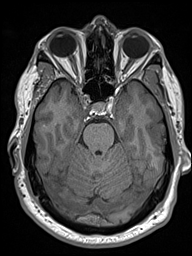
[im 80/176]
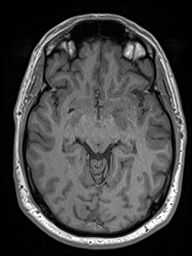
[im 96/176]
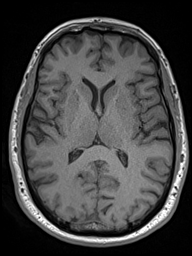
[im 112/176]
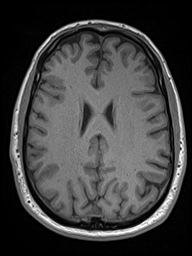
[im 128/176]
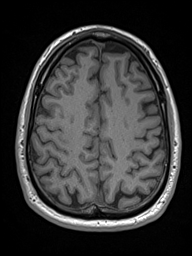
[im 144/176]
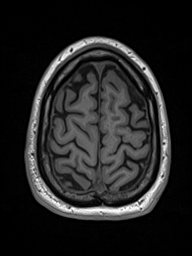
[im 160/176]
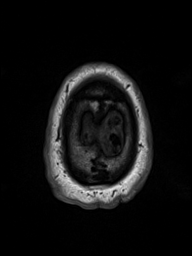
[im 176/176]
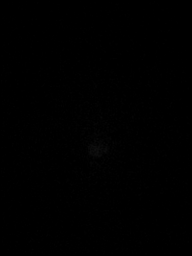

[Series 15: T2 · coronal · 3.0mm · 0.47mm/px · 2 of 31 slices shown (2 of 3)]
[im 1/31]
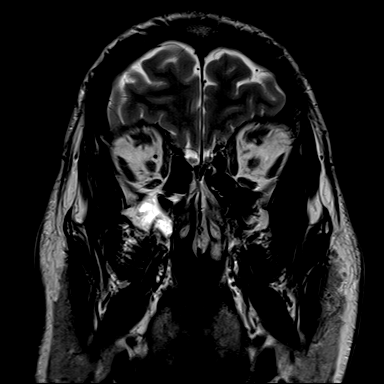
[im 31/31]
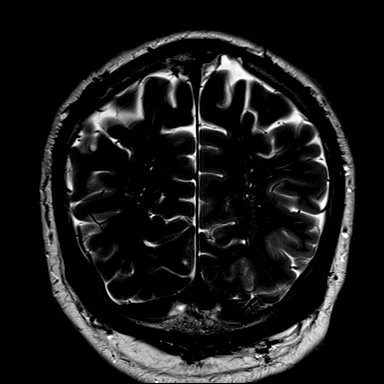

[Series 16: T2 · coronal · 4.0mm · 0.36mm/px · 3 of 39 slices shown (3 of 3)]
[im 1/39]
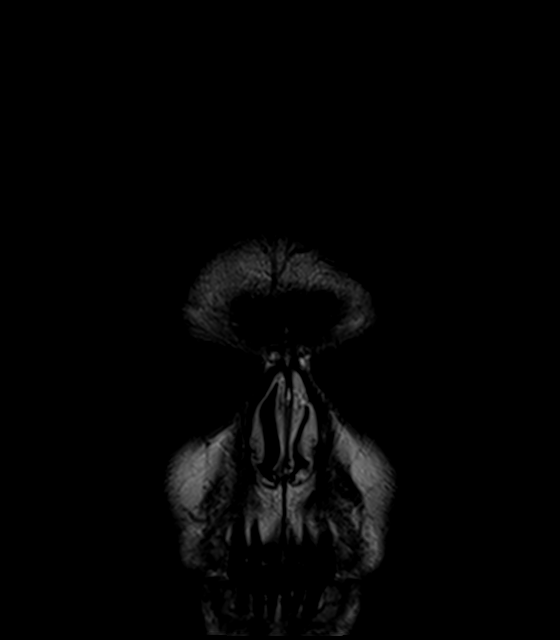
[im 20/39]
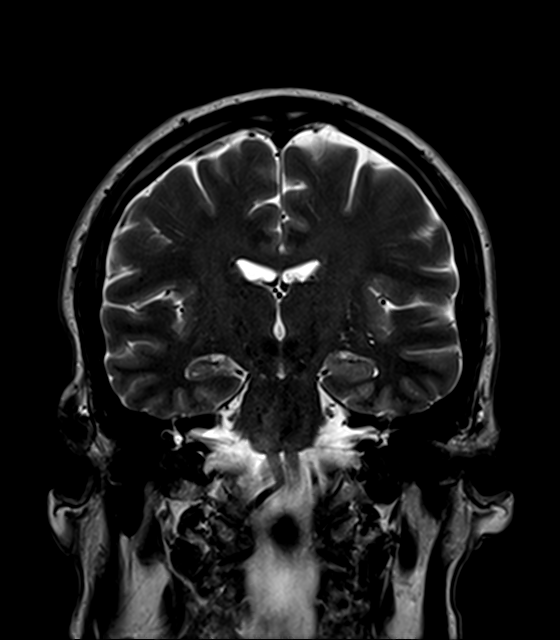
[im 39/39]
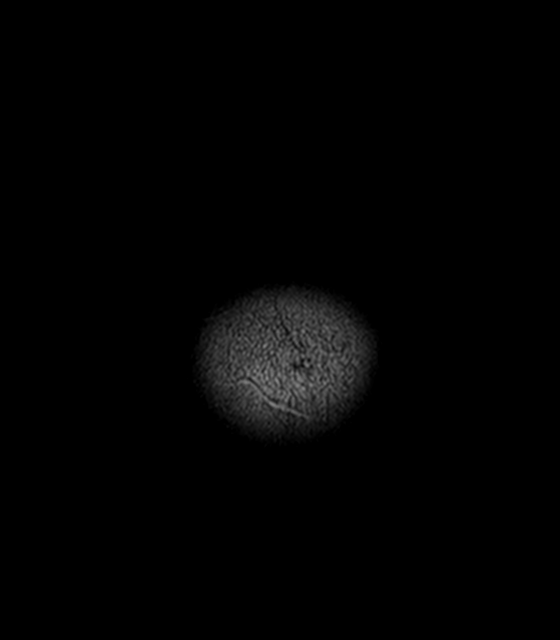

[48 of 48 positions shown; findings below may reference images not displayed]

FINDINGS: Brain: No evidence for acute infarction, hemorrhage, mass lesion,
hydrocephalus, or extra-axial fluid. Normal cerebral volume. No
white matter disease.

Thin-section imaging through the temporal lobes demonstrates no
mass, focal hippocampal atrophy, or inflammatory process. Symmetric
and normal temporal lobes.

Vascular: Flow voids are maintained throughout the carotid, basilar,
and vertebral arteries. There are no areas of chronic hemorrhage.

Skull and upper cervical spine: Unremarkable visualized calvarium,
skullbase, and cervical vertebrae. Pituitary, pineal, cerebellar
tonsils unremarkable. No upper cervical cord lesions.

Sinuses/Orbits: No orbital masses or proptosis. Globes appear
symmetric. Sinuses appear well aerated, significant maxillary sinus
opacity, greater on the RIGHT, consistent with retention cyst
formation. Largely clear sphenoid, ethmoid, and frontal sinuses.
Negative orbits.

Other: No mastoid fluid.
IMPRESSION: Negative MRI brain without contrast, with special attention to the
temporal lobes.

No epileptogenic focus is identified. There is no acute or focal
intracranial abnormality.

## 2019-06-21 ENCOUNTER — Other Ambulatory Visit: Payer: Self-pay | Admitting: *Deleted

## 2019-06-21 ENCOUNTER — Telehealth: Payer: Self-pay | Admitting: Neurology

## 2019-06-21 MED ORDER — LAMOTRIGINE 100 MG PO TABS: 100.0000 mg | ORAL_TABLET | Freq: Two times a day (BID) | ORAL | 2 refills | Status: AC

## 2019-06-21 NOTE — Telephone Encounter (Signed)
Pt has called for a refill on his lamoTRIgine (LAMICTAL) 100 MG tablet  [facility] [phone]

## 2019-06-21 NOTE — Telephone Encounter (Signed)
States he doing excellent on Lamictal.  He needs a refill sent to the pharmacy.  He plans to request his PCP to manage future refills.

## 2019-07-18 ENCOUNTER — Ambulatory Visit: Payer: Self-pay | Admitting: Neurology

## 2020-01-10 ENCOUNTER — Encounter: Payer: Self-pay | Admitting: *Deleted

## 2020-01-10 ENCOUNTER — Telehealth: Payer: Self-pay | Admitting: Neurology

## 2020-01-10 NOTE — Telephone Encounter (Signed)
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 He left a sample of what he wants it to state. I left it on Michelle's desk.  Best call back when letter is ready for pick up is [phone]

## 2020-01-10 NOTE — Telephone Encounter (Signed)
He can use the same letter previously provided but needs a statement added that there is no evidence of seizure. Dr. Terrace Arabia has authorized his request and signed a new letter. It has been placed up front for pick up. The patient is aware.
